# Patient Record
Sex: Male | Born: 2000 | Race: White | Hispanic: No | Marital: Single | Smoking: Never smoker
Health system: Southern US, Community
[De-identification: ages and names within clinical notes are randomized; demographics above are authoritative.]

## PROBLEM LIST (undated history)

## (undated) DIAGNOSIS — F329 Major depressive disorder, single episode, unspecified: Secondary | ICD-10-CM

## (undated) DIAGNOSIS — F419 Anxiety disorder, unspecified: Secondary | ICD-10-CM

## (undated) DIAGNOSIS — K589 Irritable bowel syndrome without diarrhea: Secondary | ICD-10-CM

## (undated) DIAGNOSIS — F32A Depression, unspecified: Secondary | ICD-10-CM

## (undated) HISTORY — DX: Anxiety disorder, unspecified: F41.9

## (undated) HISTORY — DX: Major depressive disorder, single episode, unspecified: F32.9

## (undated) HISTORY — DX: Depression, unspecified: F32.A

## (undated) HISTORY — DX: Irritable bowel syndrome, unspecified: K58.9

---
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
## 2021-02-28 DIAGNOSIS — H5213 Myopia, bilateral: Secondary | ICD-10-CM | POA: Diagnosis not present

## 2021-06-02 ENCOUNTER — Other Ambulatory Visit: Payer: Self-pay

## 2021-06-02 ENCOUNTER — Ambulatory Visit
Admission: EM | Admit: 2021-06-02 | Discharge: 2021-06-02 | Disposition: A | Discharge status: Home / Self Care | Payer: Medicaid Other | Attending: Family Medicine | Admitting: Family Medicine

## 2021-06-02 ENCOUNTER — Encounter: Payer: Self-pay | Admitting: Emergency Medicine

## 2021-06-02 DIAGNOSIS — H65192 Other acute nonsuppurative otitis media, left ear: Secondary | ICD-10-CM | POA: Diagnosis not present

## 2021-06-02 DIAGNOSIS — S1086XA Insect bite of other specified part of neck, initial encounter: Secondary | ICD-10-CM | POA: Diagnosis not present

## 2021-06-02 DIAGNOSIS — R0981 Nasal congestion: Secondary | ICD-10-CM

## 2021-06-02 DIAGNOSIS — W57XXXA Bitten or stung by nonvenomous insect and other nonvenomous arthropods, initial encounter: Secondary | ICD-10-CM

## 2021-06-02 MED ORDER — FLUTICASONE PROPIONATE 50 MCG/ACT NA SUSP: 1.0000 | Freq: Two times a day (BID) | NASAL | 2 refills | Status: AC

## 2021-06-02 MED ORDER — TRIAMCINOLONE ACETONIDE 0.1 % EX CREA: 1.0000 "application " | TOPICAL_CREAM | Freq: Two times a day (BID) | CUTANEOUS | 0 refills | Status: AC

## 2021-06-02 NOTE — ED Provider Notes (Signed)
[facility]    CSN: [id number] Arrival date & time: 06/02/21  1613      History   Chief Complaint No chief complaint on file.   HPI Allen Morgan is a 20 y.o. male.   Patient presenting today with 5-day history of left-sided neck swelling and soreness, left ear pain, left-sided sore throat, sinus congestion.  Denies difficulty breathing, chest pain, cough, abdominal pain, nausea vomiting or diarrhea.  So far not trying anything over-the-counter for symptoms other than Advil.  Also noticed a red bump appeared on the left side of his neck this morning that he believes to be a bug bite.  Has had multiple of these pop up recently since moving to his new house.  Has not seen any evidence of bugs.  Mild itching, no pain or drainage.  Has not tried anything over-the-counter for symptoms other than some Benadryl which did help.   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 No pertinent surgical history.   Home Medications    Prior to Admission medications   Medication Sig Start Date End Date Taking? Authorizing Provider  fluticasone (FLONASE) 50 MCG/ACT nasal spray Place 1 spray into both nostrils 2 (two) times daily. Point out toward outer corner of eye when spraying on each side 06/02/21  Yes Particia Nearing, PA-C  triamcinolone cream (KENALOG) 0.1 % Apply 1 application topically 2 (two) times daily. 06/02/21  Yes Particia Nearing, PA-C  escitalopram (LEXAPRO) 20 MG tablet Take 1 tablet (20 mg total) by mouth daily. 04/07/20   Junie Spencer, FNP  ondansetron (ZOFRAN) 4  MG tablet Take 1 tablet (4 mg total) by mouth every 8 (eight) hours as needed for nausea or vomiting. 03/10/20   Junie Spencer, FNP    Family History Family History  Problem Relation Age of Onset   Anxiety disorder Mother    Schizophrenia Maternal Grandfather    Drug abuse Maternal Grandfather    Anxiety disorder Maternal Grandmother    Depression Maternal Grandmother     Social History Social History   Tobacco Use   Smoking status: Never   Smokeless tobacco: Never  Vaping Use   Vaping Use: Never used  Substance Use Topics   Alcohol use: No   Drug use: No     Allergies   Patient has no known allergies.   Review of Systems Review of Systems Per HPI  Physical Exam Triage Vital Signs ED Triage Vitals [06/02/21 1723]  Enc Vitals Group     BP 129/76     Pulse Rate 79     Resp 18     Temp 98.9 F (37.2 C)     Temp Source Oral     SpO2 96 %     Weight      Height      Head Circumference      Peak Flow      Pain Score 4     Pain Loc      Pain Edu?      Excl. in [place]?    No data  found.  Updated Vital Signs BP 129/76 (BP Location: Right Arm)   Pulse 79   Temp 98.9 F (37.2 C) (Oral)   Resp 18   SpO2 96%   Visual Acuity Right Eye Distance:   Left Eye Distance:   Bilateral Distance:    Right Eye Near:   Left Eye Near:    Bilateral Near:     Physical Exam Vitals and nursing note reviewed.  Constitutional:      Appearance: Normal appearance.  HENT:     Head: Atraumatic.     Right Ear: Tympanic membrane normal.     Ears:     Comments: Mild left middle ear effusion    Nose: Nose normal.     Mouth/Throat:     Mouth: Mucous membranes are moist.     Pharynx: Posterior oropharyngeal erythema present. No oropharyngeal exudate.     Comments: Minimal posterior oropharyngeal erythema, edema.  No exudates, uvula midline, oral airway patent Eyes:     Extraocular Movements: Extraocular movements intact.     Conjunctiva/sclera: Conjunctivae normal.   Cardiovascular:     Rate and Rhythm: Normal rate and regular rhythm.  Pulmonary:     Effort: Pulmonary effort is normal. No respiratory distress.     Breath sounds: Normal breath sounds. No wheezing or rales.  Musculoskeletal:        General: Normal range of motion.     Cervical back: Normal range of motion and neck supple.  Skin:    General: Skin is warm.     Comments: 0.5 cm erythematous papular lesion left base of neck.  Nontender to palpation, no fluctuance or induration  Neurological:     General: No focal deficit present.     Mental Status: He is oriented to person, place, and time.  Psychiatric:        Mood and Affect: Mood normal.        Thought Content: Thought content normal.        Judgment: Judgment normal.     UC Treatments / Results  Labs (all labs ordered are listed, but only abnormal results are displayed) Labs Reviewed - No data to display  EKG   Radiology No results found.  Procedures Procedures (including critical care time)  Medications Ordered in UC Medications - No data to display  Initial Impression / Assessment and Plan / UC Course  I have reviewed the triage vital signs and the nursing notes.  Pertinent labs & imaging results that were available during my care of the patient were reviewed by me and considered in my medical decision making (see chart for details).     Suspect viral versus allergic sinus congestion, ear effusion.  Treat with Flonase, antihistamines, DayQuil, NyQuil, supportive home care.  Suspect insect bite to be causing his papular lesion.  Triamcinolone, ice off-and-on recommended.  Return for acutely worsening symptoms  Final Clinical Impressions(s) / UC Diagnoses   Final diagnoses:  Sinus congestion  Acute effusion of left ear  Insect bite of other part of neck, initial encounter   Discharge Instructions   None    ED Prescriptions     Medication Sig Dispense Auth. Provider   fluticasone (FLONASE) 50 MCG/ACT  nasal spray Place 1 spray into both nostrils 2 (two) times daily. Point out toward outer corner of eye when spraying on each side 18 g Particia Nearing, PA-C   triamcinolone cream (KENALOG) 0.1 % Apply 1 application topically 2 (two) times daily. 30 g Roosvelt Maser  Lanora Manis, PA-C      PDMP not reviewed this encounter.   Particia Nearing, [place] 06/02/21 [phone]

## 2021-06-02 NOTE — ED Triage Notes (Signed)
Left sided neck pain and left ear pain x 5 days, red knot showed up on neck today.

## 2021-12-03 ENCOUNTER — Ambulatory Visit (INDEPENDENT_AMBULATORY_CARE_PROVIDER_SITE_OTHER): Payer: Medicaid Other

## 2021-12-03 ENCOUNTER — Ambulatory Visit (HOSPITAL_COMMUNITY)
Admission: EM | Admit: 2021-12-03 | Discharge: 2021-12-03 | Disposition: A | Discharge status: Home / Self Care | Payer: Medicaid Other | Attending: Internal Medicine | Admitting: Internal Medicine

## 2021-12-03 DIAGNOSIS — M79645 Pain in left finger(s): Secondary | ICD-10-CM | POA: Diagnosis not present

## 2021-12-03 DIAGNOSIS — S6992XA Unspecified injury of left wrist, hand and finger(s), initial encounter: Secondary | ICD-10-CM | POA: Diagnosis not present

## 2021-12-03 NOTE — ED Triage Notes (Signed)
Pt reports his middle finger is broken on his left hand. He reports getting his finger caught on something yesterday.  ?

## 2021-12-03 NOTE — Discharge Instructions (Signed)
Your x-ray was negative for any break or dislocation.  Suspect sprain as cause of pain.  Please use ice application and take over-the-counter pain relievers as needed.  Follow-up if symptoms persist or worsen. ?

## 2021-12-03 NOTE — ED Provider Notes (Signed)
?[facility] ? ? ? ?CSN: [id number] ?Arrival date & time: 12/03/21  1424 ? ? ?  ? ?History   ?Chief Complaint ?Chief Complaint  ?Patient presents with  ? Finger Injury  ? ? ?HPI ?Allen Morgan is a 21 y.o. male.  ? ?Patient presents with left third digit pain that started yesterday after an injury.  Patient is very vague about what happened and is a poor historian but reports that he was walking "and caught his finger on something and thinks it was fabric".  Having pain throughout the left third digit.  He reports that it was "crooked yesterday".  Denies any numbness or tingling.  Limited range of motion but patient can flex and extend finger. ? ? ? ?Past Medical History:  ?Diagnosis Date  ? Anxiety   ? Depression   ? Irritable bowel   ? ? ?Patient Active Problem List  ? Diagnosis Date Noted  ? Major depression, single episode 09/11/2018  ? GAD (generalized anxiety disorder) 05/30/2018  ? Nausea 12/27/2016  ? Gastroesophageal reflux disease without esophagitis 12/27/2016  ? 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 Fever 09/06/2016  ? Irritable bowel syndrome 09/06/2016  ? ? ?No past surgical history on file. ? ? ? ? ?Home Medications   ? ?Prior to Admission medications   ?Medication Sig Start Date End Date Taking? Authorizing Provider  ?escitalopram (LEXAPRO) 20 MG tablet Take 1 tablet (20 mg total) by mouth daily. 04/07/20   Sharion Balloon, FNP  ?fluticasone (FLONASE) 50 MCG/ACT nasal spray Place 1 spray into both nostrils 2 (two) times daily. Point out toward outer corner of eye when spraying on each side 06/02/21   Volney American, PA-C  ?ondansetron (ZOFRAN) 4 MG tablet Take 1 tablet (4 mg total) by mouth every 8 (eight) hours as needed for nausea or vomiting. 03/10/20   Sharion Balloon, FNP  ?triamcinolone cream (KENALOG) 0.1 % Apply 1 application topically 2 (two) times daily. 06/02/21   Volney American, PA-C  ? ? ?Family History ?Family History  ?Problem Relation Age of Onset  ? Anxiety  disorder Mother   ? Schizophrenia Maternal Grandfather   ? Drug abuse Maternal Grandfather   ? Anxiety disorder Maternal Grandmother   ? Depression Maternal Grandmother   ? ? ?Social History ?Social History  ? ?Tobacco Use  ? Smoking status: Never  ? Smokeless tobacco: Never  ?Vaping Use  ? Vaping Use: Never used  ?Substance Use Topics  ? Alcohol use: No  ? Drug use: No  ? ? ? ?Allergies   ?Patient has no known allergies. ? ? ?Review of Systems ?Review of Systems ?Per HPI ? ?Physical Exam ?Triage Vital Signs ?ED Triage Vitals [12/03/21 1514]  ?Enc Vitals Group  ?   BP 121/73  ?   Pulse Rate (!) 49  ?   Resp 16  ?   Temp 97.8 ?F (36.6 ?C)  ?   Temp src   ?   SpO2 98 %  ?   Weight   ?   Height   ?   Head Circumference   ?   Peak Flow   ?   Pain Score   ?   Pain Loc   ?   Pain Edu?   ?   Excl. in [place]?   ? ?No data found. ? ?Updated Vital Signs ?BP 121/73 (BP Location: Left Arm)   Pulse 72   Temp 97.8 ?F (36.6 ?C)   Resp 16  SpO2 98%  ? ?Visual Acuity ?Right Eye Distance:   ?Left Eye Distance:   ?Bilateral Distance:   ? ?Right Eye Near:   ?Left Eye Near:    ?Bilateral Near:    ? ?Physical Exam ?Constitutional:   ?   General: He is not in acute distress. ?   Appearance: Normal appearance. He is not toxic-appearing or diaphoretic.  ?HENT:  ?   Head: Normocephalic and atraumatic.  ?Eyes:  ?   Extraocular Movements: Extraocular movements intact.  ?   Conjunctiva/sclera: Conjunctivae normal.  ?Pulmonary:  ?   Effort: Pulmonary effort is normal.  ?Musculoskeletal:  ?   Comments: Tenderness to palpation throughout left third digit but generalized through PIP joint with associated swelling and bluish discoloration.  Limited flexion due to pain but patient can fully extend finger.  Capillary refill normal.  Pulses normal.  Neurovascular intact.  ?Neurological:  ?   General: No focal deficit present.  ?   Mental Status: He is alert and oriented to person, place, and time. Mental status is at baseline.  ?Psychiatric:     ?    Mood and Affect: Mood normal.     ?   Behavior: Behavior normal.     ?   Thought Content: Thought content normal.     ?   Judgment: Judgment normal.  ? ? ? ?UC Treatments / Results  ?Labs ?(all labs ordered are listed, but only abnormal results are displayed) ?Labs Reviewed - No data to display ? ?EKG ? ? ?Radiology ?DG Finger Middle Left ? ?Result Date: 12/03/2021 ?CLINICAL DATA:  Left third finger pain. EXAM: LEFT MIDDLE FINGER 2+V COMPARISON:  None. FINDINGS: There is no evidence of fracture or dislocation. There is no evidence of arthropathy or other focal bone abnormality. Mild soft tissue swelling. IMPRESSION: 1. No acute fracture or dislocation identified about the left third digit. 2. Mild soft tissue swelling. Electronically Signed   By: Fidela Salisbury M.D.   On: 12/03/2021 16:24   ? ?Procedures ?Procedures (including critical care time) ? ?Medications Ordered in UC ?Medications - No data to display ? ?Initial Impression / Assessment and Plan / UC Course  ?I have reviewed the triage vital signs and the nursing notes. ? ?Pertinent labs & imaging results that were available during my care of the patient were reviewed by me and considered in my medical decision making (see chart for details). ? ?  ? ?Left finger x-ray was negative for any acute bony abnormality.  Suspect muscular injury/strain.  Discussed supportive care, ice application, over-the-counter pain relievers.  Discussed return precautions.  Patient verbalized understanding and was agreeable with plan. ?Final Clinical Impressions(s) / UC Diagnoses  ? ?Final diagnoses:  ?Injury of finger of left hand, initial encounter  ?Finger pain, left  ? ? ? ?Discharge Instructions   ? ?  ?Your x-ray was negative for any break or dislocation.  Suspect sprain as cause of pain.  Please use ice application and take over-the-counter pain relievers as needed.  Follow-up if symptoms persist or worsen. ? ? ? ? ?ED Prescriptions   ?None ?  ? ?PDMP not reviewed this  encounter. ?  ?Teodora Medici, [place] ?12/03/21 [id number] ? ?
